# Patient Record
Sex: Male | Born: 1980 | Race: White | Hispanic: No | Marital: Single | Smoking: Never smoker
Health system: Southern US, Community
[De-identification: ages and names within clinical notes are randomized; demographics above are authoritative.]

## PROBLEM LIST (undated history)

## (undated) DIAGNOSIS — T7840XA Allergy, unspecified, initial encounter: Secondary | ICD-10-CM

## (undated) DIAGNOSIS — K921 Melena: Secondary | ICD-10-CM

## (undated) HISTORY — PX: SHOULDER SURGERY: SHX246

## (undated) HISTORY — DX: Melena: K92.1

## (undated) HISTORY — PX: ANTERIOR CRUCIATE LIGAMENT REPAIR: SHX115

## (undated) HISTORY — PX: MENISCUS REPAIR: SHX5179

## (undated) HISTORY — DX: Allergy, unspecified, initial encounter: T78.40XA

---
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
## 2006-03-15 ENCOUNTER — Ambulatory Visit: Payer: Self-pay | Admitting: Family Medicine

## 2006-06-01 ENCOUNTER — Ambulatory Visit: Payer: Self-pay | Admitting: Family Medicine

## 2008-01-19 ENCOUNTER — Ambulatory Visit: Payer: Self-pay | Admitting: Family Medicine

## 2011-03-10 ENCOUNTER — Other Ambulatory Visit: Payer: Self-pay | Admitting: Neurological Surgery

## 2011-03-10 DIAGNOSIS — M542 Cervicalgia: Secondary | ICD-10-CM

## 2011-03-14 ENCOUNTER — Other Ambulatory Visit: Payer: Self-pay

## 2011-09-28 ENCOUNTER — Ambulatory Visit (INDEPENDENT_AMBULATORY_CARE_PROVIDER_SITE_OTHER): Payer: Managed Care, Other (non HMO)

## 2011-09-28 DIAGNOSIS — R05 Cough: Secondary | ICD-10-CM

## 2011-09-28 DIAGNOSIS — IMO0001 Reserved for inherently not codable concepts without codable children: Secondary | ICD-10-CM

## 2013-06-12 ENCOUNTER — Ambulatory Visit (INDEPENDENT_AMBULATORY_CARE_PROVIDER_SITE_OTHER): Payer: BC Managed Care – PPO | Admitting: Nurse Practitioner

## 2013-06-12 ENCOUNTER — Encounter: Payer: Self-pay | Admitting: Nurse Practitioner

## 2013-06-12 VITALS — BP 104/70 | HR 53 | Temp 97.8°F | Ht 74.5 in | Wt 192.0 lb

## 2013-06-12 DIAGNOSIS — L255 Unspecified contact dermatitis due to plants, except food: Secondary | ICD-10-CM

## 2013-06-12 DIAGNOSIS — Z23 Encounter for immunization: Secondary | ICD-10-CM

## 2013-06-12 MED ORDER — TETANUS-DIPHTH-ACELL PERTUSSIS 5-2.5-18.5 LF-MCG/0.5 IM SUSP
0.5000 mL | Freq: Once | INTRAMUSCULAR | Status: AC
  Administered 2013-06-12: 0.5 mL via INTRAMUSCULAR

## 2013-06-12 MED ORDER — PREDNISONE 10 MG PO TABS: ORAL_TABLET | ORAL | Status: DC

## 2013-06-12 MED ORDER — METHYLPREDNISOLONE ACETATE 40 MG/ML IJ SUSP
40.0000 mg | Freq: Once | INTRAMUSCULAR | Status: AC
  Administered 2013-06-12: 40 mg via INTRAMUSCULAR

## 2013-06-12 NOTE — Patient Instructions (Signed)
Stop oral benadryl. Start benadryl (diphenhydramine) cream on rash. Use ice to help with itching. Start prednisone no sooner than tomorrow if rash is getting worse. Zanafel wash can help neutralize plant oils on surfaces as they can cause rash up to 1 year. Wash clothes that may have plant oils on them in hot water. Schedule physical at your convenience.  Poison [employer] ivy is a inflammation of the skin (contact dermatitis) caused by touching the allergens on the leaves of the ivy plant following previous exposure to the plant. The rash usually appears 48 hours after exposure. The rash is usually bumps (papules) or blisters (vesicles) in a linear pattern. Depending on your own sensitivity, the rash may simply cause redness and itching, or it may also progress to blisters which may break open. These must be well cared for to prevent secondary bacterial (germ) infection, followed by scarring. Keep any open areas dry, clean, dressed, and covered with an antibacterial ointment if needed. The eyes may also get puffy. The puffiness is worst in the morning and gets better as the day progresses. This dermatitis usually heals without scarring, within 2 to 3 weeks without treatment. HOME CARE INSTRUCTIONS  Thoroughly wash with soap and water as soon as you have been exposed to poison ivy. You have about one half hour to remove the plant resin before it will cause the rash. This washing will destroy the oil or antigen on the skin that is causing, or will cause, the rash. Be sure to wash under your fingernails as any plant resin there will continue to spread the rash. Do not rub skin vigorously when washing affected area. Poison ivy cannot spread if no oil from the plant remains on your body. A rash that has progressed to weeping sores will not spread the rash unless you have not washed thoroughly. It is also important to wash any clothes you have been wearing as these may carry active allergens. The rash will return if  you wear the unwashed clothing, even several days later. Avoidance of the plant in the future is the best measure. Poison ivy plant can be recognized by the number of leaves. Generally, poison ivy has three leaves with flowering branches on a single stem. Diphenhydramine may be purchased over the counter and used as needed for itching. Do not drive with this medication if it makes you drowsy.Ask your caregiver about medication for children. SEEK MEDICAL CARE IF:  Open sores develop.  Redness spreads beyond area of rash.  You notice purulent (pus-like) discharge.  You have increased pain.  Other signs of infection develop (such as fever). Document Released: 08/21/2000 Document Revised: 11/16/2011 Document Reviewed: 07/10/2009 [facility] Patient Information 2014 [place], [place].

## 2013-06-12 NOTE — Progress Notes (Signed)
  Subjective:    Patient ID: Daniel Guzman, male    DOB: 17-Oct-1980, 32 y.o.   MRN: [id number]  HPI Comments: Pt wishes to establish care here. He has been receiving primary care from Dr Juleen China. Last physical was 09/2012. Signifiacnt medical history includes bright red blood on tissue & in toilet at times. Pt thinks may be r/t caffeine intake and occasional straining at stool. PCP performed rectal exam at last physical. Pt states he was reassured no major problem.   Rash This is a new problem. The current episode started in the past 7 days. The problem has been gradually worsening since onset. The affected locations include the face, left lower leg, right lower leg, right arm and left arm. The rash is characterized by blistering, redness and itchiness. He was exposed to plant contact. Pertinent negatives include no cough, facial edema, fatigue, fever, joint pain, shortness of breath or vomiting. Past treatments include antihistamine. The treatment provided mild relief. His past medical history is significant for allergies (itchy eyes). There is no history of asthma or eczema.      Review of Systems  Constitutional: Negative for fever, activity change and fatigue.  HENT: Negative for facial swelling.   Eyes: Positive for redness (mildly injected sclera bilaterally).  Respiratory: Negative for cough, chest tightness, shortness of breath and wheezing.   Cardiovascular: Negative for chest pain.  Gastrointestinal: Positive for nausea (thinks r/t taking oral benadryl.). Negative for vomiting and abdominal pain.  Musculoskeletal: Negative for joint pain and arthralgias.  Skin: Positive for rash.       Objective:   Physical Exam  Constitutional: He is oriented to person, place, and time. He appears well-developed and well-nourished. No distress.  HENT:  Head: Normocephalic and atraumatic.  Right Ear: External ear normal.  Left Ear: External ear normal.  Erythema behind pinna L ear-pt states  itchy  Eyes: Right eye exhibits no discharge. Left eye exhibits no discharge.  Sclera slightly injected, does not c/o dry eyes.  Cardiovascular: Normal rate.   Pulmonary/Chest: Effort normal and breath sounds normal.  Musculoskeletal:  Normal gait  Neurological: He is alert and oriented to person, place, and time.  Skin: Skin is warm and dry. Lesion and rash noted. Rash is vesicular. There is erythema.     Linear vesicular rash, multiple areas.  Psychiatric: He has a normal mood and affect. His behavior is normal. Thought content normal.          Assessment & Plan:  1. Plant dermatitis See pt instructions. - methylPREDNISolone acetate (DEPO-MEDROL) injection 40 mg; Inject 1 mL (40 mg total) into the muscle once. - predniSONE (DELTASONE) 10 MG tablet; Take 3 T po qd X 3d, then, 2T po qd X3d, then 1T po qd X 3d, then 1/2 T po qd X 3d, then d/c.  Dispense: 20 tablet; Refill: 0 2 Need for Tdap. Admin today. Had flu vaccine 3 wks ago.

## 2013-06-12 NOTE — Addendum Note (Signed)
Addended by: Marlene Lard on: 06/12/2013 11:15 AM   Modules accepted: Orders

## 2013-09-12 ENCOUNTER — Ambulatory Visit (INDEPENDENT_AMBULATORY_CARE_PROVIDER_SITE_OTHER): Payer: BC Managed Care – PPO | Admitting: Nurse Practitioner

## 2013-09-12 ENCOUNTER — Encounter: Payer: Self-pay | Admitting: Nurse Practitioner

## 2013-09-12 VITALS — BP 122/80 | HR 62 | Temp 97.7°F | Ht 74.5 in | Wt 194.0 lb

## 2013-09-12 DIAGNOSIS — J069 Acute upper respiratory infection, unspecified: Secondary | ICD-10-CM

## 2013-09-12 NOTE — Progress Notes (Signed)
Pre-visit discussion using our clinic review tool. No additional management support is needed unless otherwise documented below in the visit note.  

## 2013-09-12 NOTE — Patient Instructions (Signed)
You have a virus causing your symptoms. The average duration of cold symptoms is 14 days. Start daily sinus rinses (neilmed Sinus Rinse). Use 30 mg to 60 mg pseudoephedrine twice daily. Sip fluids every hour. Rest. If you do not experience relief in your symptoms within 3 days, please call our office and I will prescribe an antibiotic. Feel better!  Upper Respiratory Infection, Adult An upper respiratory infection (URI) is also sometimes known as the common cold. The upper respiratory tract includes the nose, sinuses, throat, trachea, and bronchi. Bronchi are the airways leading to the lungs. Most people improve within 1 week, but symptoms can last up to 2 weeks. A residual cough may last even longer.  CAUSES Many different viruses can infect the tissues lining the upper respiratory tract. The tissues become irritated and inflamed and often become very moist. Mucus production is also common. A cold is contagious. You can easily spread the virus to others by oral contact. This includes kissing, sharing a glass, coughing, or sneezing. Touching your mouth or nose and then touching a surface, which is then touched by another person, can also spread the virus. SYMPTOMS  Symptoms typically develop 1 to 3 days after you come in contact with a cold virus. Symptoms vary from person to person. They may include:  Runny nose.  Sneezing.  Nasal congestion.  Sinus irritation.  Sore throat.  Loss of voice (laryngitis).  Cough.  Fatigue.  Muscle aches.  Loss of appetite.  Headache.  Low-grade fever. DIAGNOSIS  You might diagnose your own cold based on familiar symptoms, since most people get a cold 2 to 3 times a year. Your caregiver can confirm this based on your exam. Most importantly, your caregiver can check that your symptoms are not due to another disease such as strep throat, sinusitis, pneumonia, asthma, or epiglottitis. Blood tests, throat tests, and X-rays are not necessary to diagnose a  common cold, but they may sometimes be helpful in excluding other more serious diseases. Your caregiver will decide if any further tests are required. RISKS AND COMPLICATIONS  You may be at risk for a more severe case of the common cold if you smoke cigarettes, have chronic heart disease (such as heart failure) or lung disease (such as asthma), or if you have a weakened immune system. The very young and very old are also at risk for more serious infections. Bacterial sinusitis, middle ear infections, and bacterial pneumonia can complicate the common cold. The common cold can worsen asthma and chronic obstructive pulmonary disease (COPD). Sometimes, these complications can require emergency medical care and may be life-threatening. PREVENTION  The best way to protect against getting a cold is to practice good hygiene. Avoid oral or hand contact with people with cold symptoms. Wash your hands often if contact occurs. There is no clear evidence that vitamin C, vitamin E, echinacea, or exercise reduces the chance of developing a cold. However, it is always recommended to get plenty of rest and practice good nutrition. TREATMENT  Treatment is directed at relieving symptoms. There is no cure. Antibiotics are not effective, because the infection is caused by a virus, not by bacteria. Treatment may include:  Increased fluid intake. Sports drinks offer valuable electrolytes, sugars, and fluids.  Breathing heated mist or steam (vaporizer or shower).  Eating chicken soup or other clear broths, and maintaining good nutrition.  Getting plenty of rest.  Using gargles or lozenges for comfort.  Controlling fevers with ibuprofen or acetaminophen as directed by your  caregiver.  Increasing usage of your inhaler if you have asthma. Zinc gel and zinc lozenges, taken in the first 24 hours of the common cold, can shorten the duration and lessen the severity of symptoms. Pain medicines may help with fever, muscle  aches, and throat pain. A variety of non-prescription medicines are available to treat congestion and runny nose. Your caregiver can make recommendations and may suggest nasal or lung inhalers for other symptoms.  HOME CARE INSTRUCTIONS   Only take over-the-counter or prescription medicines for pain, discomfort, or fever as directed by your caregiver.  Use a warm mist humidifier or inhale steam from a shower to increase air moisture. This may keep secretions moist and make it easier to breathe.  Drink enough water and fluids to keep your urine clear or pale yellow.  Rest as needed.  Return to work when your temperature has returned to normal or as your caregiver advises. You may need to stay home longer to avoid infecting others. You can also use a face mask and careful hand washing to prevent spread of the virus. SEEK MEDICAL CARE IF:   After the first few days, you feel you are getting worse rather than better.  You need your caregiver's advice about medicines to control symptoms.  You develop chills, worsening shortness of breath, or brown or red sputum. These may be signs of pneumonia.  You develop yellow or brown nasal discharge or pain in the face, especially when you bend forward. These may be signs of sinusitis.  You develop a fever, swollen neck glands, pain with swallowing, or white areas in the back of your throat. These may be signs of strep throat. SEEK IMMEDIATE MEDICAL CARE IF:   You have a fever.  You develop severe or persistent headache, ear pain, sinus pain, or chest pain.  You develop wheezing, a prolonged cough, cough up blood, or have a change in your usual mucus (if you have chronic lung disease).  You develop sore muscles or a stiff neck. Document Released: 02/17/2001 Document Revised: 11/16/2011 Document Reviewed: 12/26/2010 [facility] Patient Information 2014 [place].

## 2013-09-12 NOTE — Progress Notes (Signed)
   Subjective:    Patient ID: Daniel MoatsMatthew D Guzman, male    DOB: 04/16/1981, 33 y.o.   MRN: [id number]  Sore Throat  This is a new problem. The current episode started 1 to 4 weeks ago (9 d). The problem has been unchanged. Neither side of throat is experiencing more pain than the other. There has been no fever. The patient is experiencing no pain. Associated symptoms include congestion (nasal & chest), coughing and headaches. Pertinent negatives include no abdominal pain, diarrhea, shortness of breath or swollen glands. He has had no exposure to strep or mono. Treatments tried: OTC cold meds. The treatment provided mild relief.      Review of Systems  Constitutional: Positive for fatigue.  HENT: Positive for congestion (nasal & chest), postnasal drip, sinus pressure, sore throat and voice change (nasal).   Respiratory: Positive for cough. Negative for chest tightness, shortness of breath and wheezing.   Gastrointestinal: Negative for abdominal pain and diarrhea.  Musculoskeletal: Negative for back pain.  Neurological: Positive for headaches.       Objective:   Physical Exam  Vitals reviewed. Constitutional: He is oriented to person, place, and time. He appears well-developed and well-nourished. No distress.  HENT:  Head: Normocephalic and atraumatic.  Right Ear: External ear normal.  Left Ear: External ear normal.  Mouth/Throat: Oropharynx is clear and moist. No oropharyngeal exudate.  Eyes: Conjunctivae are normal. Right eye exhibits no discharge. Left eye exhibits no discharge.  Neck: Normal range of motion. Neck supple. No thyromegaly present.  Cardiovascular: Normal rate, regular rhythm and normal heart sounds.   No murmur heard. Pulmonary/Chest: Effort normal and breath sounds normal. No respiratory distress. He has no wheezes.  Lymphadenopathy:    He has no cervical adenopathy.  Neurological: He is alert and oriented to person, place, and time.  Skin: Skin is warm and dry.    Psychiatric: He has a normal mood and affect. His behavior is normal. Thought content normal.          Assessment & Plan:  1. Acute upper respiratory infections of unspecified site See pt instructions.

## 2014-09-03 ENCOUNTER — Other Ambulatory Visit: Payer: Self-pay | Admitting: Orthopedic Surgery

## 2014-09-03 DIAGNOSIS — M5412 Radiculopathy, cervical region: Secondary | ICD-10-CM

## 2014-09-09 ENCOUNTER — Ambulatory Visit
Admission: RE | Admit: 2014-09-09 | Discharge: 2014-09-09 | Disposition: A | Discharge status: Home / Self Care | Payer: BC Managed Care – PPO | Source: Ambulatory Visit | Attending: Orthopedic Surgery | Admitting: Orthopedic Surgery

## 2014-09-09 DIAGNOSIS — M5412 Radiculopathy, cervical region: Secondary | ICD-10-CM

## 2016-01-09 ENCOUNTER — Ambulatory Visit (INDEPENDENT_AMBULATORY_CARE_PROVIDER_SITE_OTHER): Payer: BLUE CROSS/BLUE SHIELD | Admitting: Family Medicine

## 2016-01-09 ENCOUNTER — Encounter: Payer: Self-pay | Admitting: Family Medicine

## 2016-01-09 VITALS — BP 108/80 | HR 62 | Temp 97.7°F | Ht 75.0 in | Wt 196.0 lb

## 2016-01-09 DIAGNOSIS — R202 Paresthesia of skin: Secondary | ICD-10-CM

## 2016-01-09 DIAGNOSIS — Z7689 Persons encountering health services in other specified circumstances: Secondary | ICD-10-CM

## 2016-01-09 DIAGNOSIS — S86819A Strain of other muscle(s) and tendon(s) at lower leg level, unspecified leg, initial encounter: Secondary | ICD-10-CM | POA: Diagnosis not present

## 2016-01-09 DIAGNOSIS — S86119A Strain of other muscle(s) and tendon(s) of posterior muscle group at lower leg level, unspecified leg, initial encounter: Secondary | ICD-10-CM

## 2016-01-09 DIAGNOSIS — Z7189 Other specified counseling: Secondary | ICD-10-CM

## 2016-01-09 LAB — TSH: TSH: 1.64 u[IU]/mL (ref 0.35–4.50)

## 2016-01-09 LAB — VITAMIN B12: VITAMIN B 12: 501 pg/mL (ref 211–911)

## 2016-01-09 NOTE — Progress Notes (Signed)
HPI:  Daniel Guzman is here to establish care. Was doing yearly history with family friend, has had physical in last year with labs - reports all normal.   Has the following chronic problems that require follow up and concerns today:  Paresthesia: -started 4-5 months ago -3rd and 4th fingers of L hand: -only in tips of these fingers, goes away on weekend -comes and goes, usually notices in the evenings -does a lot of typing -no weakness, pain, malaise, paresthesias elsewhere -lifts weights, lb 185  Tight gastrocs: -started a year -was running a few miles, sprinting at the time -injured, "popped" both calves at one point - bruised initially -if runs on them painful and bruised, bilaterally -no weakness, numbness, malaise, fevers -cycles and does gentle running is ok but if runs fast bothers him -hx L acl and meniscus surgery in the past, R shoulder surgery, turn labrum - sees Dr. Thurston Hole   ROS negative for unless reported above: fevers, unintentional weight loss, hearing or vision loss, chest pain, palpitations, struggling to breath, hemoptysis, melena, hematochezia, hematuria, falls, loc, si, thoughts of self harm  Past Medical History  Diagnosis Date  . Blood in stool     thinks hemorrhoid, rare with BM  . Allergy     Past Surgical History  Procedure Laterality Date  . Meniscus repair      Left  . Shoulder surgery      Right  . Anterior cruciate ligament repair      Left    Family History  Problem Relation Age of Onset  . Cancer Father     bladder, Actos- thought to be related  . Diabetes Father   . Heart disease Maternal Grandfather   . Heart disease Paternal Grandmother   . Heart disease Paternal Grandfather     Social History   Social History  . Marital Status: Single    Spouse Name: N/A  . Number of Children: 0  . Years of Education: N/A   Occupational History  . buyer [name]   Social History Main Topics  . Smoking status:  Never Smoker   . Smokeless tobacco: Never Used  . Alcohol Use: 0.0 oz/week    0 Standard drinks or equivalent per week     Comment: few drinks occassionally  . Drug Use: No  . Sexual Activity: Not Asked   Other Topics Concern  . 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  Omega-3 Fatty Acids (FISH OIL PO), Take by mouth daily., Disp: , Rfl:   EXAM:  Filed Vitals:   01/09/16 0705  BP: 108/80  Pulse: 62  Temp: 97.7 F (36.5 C)    Body mass index is 24.5 kg/(m^2).  GENERAL: vitals reviewed and listed above, alert, oriented, appears well hydrated and in no acute distress  HEENT: atraumatic, conjunttiva clear, no obvious abnormalities on inspection of external nose and ears  NECK: no obvious masses on inspection  LUNGS: clear to auscultation bilaterally, no wheezes, rales or rhonchi, good air movement  CV: HRRR, no peripheral edema  MS:  moves all extremities without noticeable abnormality, normal inspection both hands except for calluses both hands, wrists, arms, normal sensitivity to light touch, normal strength throughout, neg tinels, neg phalens, neg ulnar tunnel tapping, Normal range of motion of the head and neck, no bony tenderness to palpation in the cervical spine, negative Spurling's test. Normal inspection of both lower extremities and calves, no tenderness to palpation over the deep veins, no swelling, normal calf muscle function and strength, no muscular or bony tenderness to palpation, neurovascularly intact distally   PSYCH: pleasant and cooperative, no obvious depression or anxiety  ASSESSMENT AND PLAN:  Discussed the following assessment and  plan:  Paresthesia - Plan: TSH, Vitamin B12 -Normal exam, suspect related to typing or lifting and local nerve issue, distribution is a little on -We'll check labs, advised proper typing position and mechanics and wrist brace at night to see if this resolves his issues -Follow up if worsening or not improving and in 3 months  Gastrocnemius muscle strain, unspecified laterality, initial encounter -Normal exam -Home exercises provided and advised gradual return to running -Follow up 3 months, sports medicine consult if not improving or symptoms recur  Encounter to establish care -We reviewed the PMH, PSH, FH, SH, Meds and Allergies. -We provided refills for any medications we will prescribe as needed. -We addressed current concerns per orders and patient instructions. -We have asked for records for pertinent exams, studies, vaccines and notes from previous providers. -We have advised patient to follow up per instructions below.   -Patient advised to return or notify a doctor immediately if symptoms worsen or persist or new concerns arise.  Patient Instructions  BEFORE YOU LEAVE: -labs -schedule follow up in 3 months  For the calf issues: -do the exercises 4 days per week -in 1 month start to gradually ease back into running, start slow, gradually build up mileage, then gradually build up speed -continue the exercises 4 days per week -halt process and let us know if any further bruising or significant pain  For the hand issues: -ensure proper mechanics with sitting position at desk -wear firm wrist support at night  We recommend the following healthy lifestyle measures: - eat a healthy whole foods diet consisting of regular small meals composed of vegetables, fruits, beans, nuts, seeds, healthy meats such as white chicken and fish and whole grains.  - avoid sweets, white starchy foods, fried foods, fast food, processed foods, sodas, red meet and other fattening foods.  - get a  least 150-300 minutes of aerobic exercise per week.   -We have ordered labs or studies at this visit. It can take up to 1-2 weeks for results and processing. We will contact you with instructions IF your results are abnormal. Normal results will be released to your [facility]. If you have not heard from [place] or can not find your results in [facility] in 2 weeks please contact our office.            Kriste BasqueKIM, Otto Caraway R.

## 2016-01-09 NOTE — Patient Instructions (Signed)
BEFORE YOU LEAVE: -labs -schedule follow up in 3 months  For the calf issues: -do the exercises 4 days per week -in 1 month start to gradually ease back into running, start slow, gradually build up mileage, then gradually build up speed -continue the exercises 4 days per week -halt process and let us know if any further bruising or significant pain  For the hand issues: -ensure proper mechanics with sitting position at desk -wear firm wrist support at night  We recommend the following healthy lifestyle measures: - eat a healthy whole foods diet consisting of regular small meals composed of vegetables, fruits, beans, nuts, seeds, healthy meats such as white chicken and fish and whole grains.  - avoid sweets, white starchy foods, fried foods, fast food, processed foods, sodas, red meet and other fattening foods.  - get a least 150-300 minutes of aerobic exercise per week.   -We have ordered labs or studies at this visit. It can take up to 1-2 weeks for results and processing. We will contact you with instructions IF your results are abnormal. Normal results will be released to your [facility]. If you have not heard from us or can not find your results in [facility] in 2 weeks please contact our office.

## 2016-02-26 IMAGING — MR MR CERVICAL SPINE W/O CM
5 series · 29 of 48 positions shown · non-contrast
Comparison: None.

CLINICAL DATA: Acute neck pain since Monday April, 2014. Pain worse on
the right with radiation. Worse with activity.

EXAM:
MRI CERVICAL SPINE WITHOUT CONTRAST
TECHNIQUE: Multiplanar, multisequence MR imaging of the cervical spine was
performed. No intravenous contrast was administered.

[Series 4: T2 · sagittal · 3.0mm · 0.66mm/px · 6 of 13 slices shown (1 of 2)]
[im 1/13]
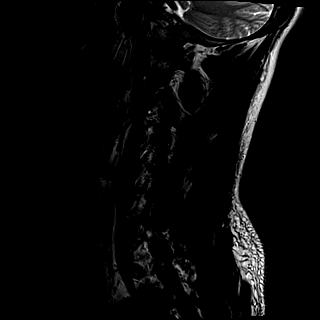
[im 3/13]
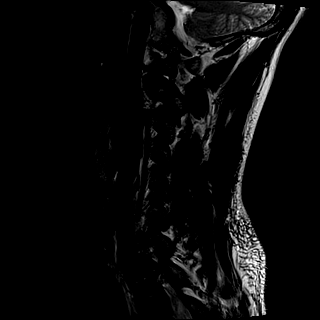
[im 5/13]
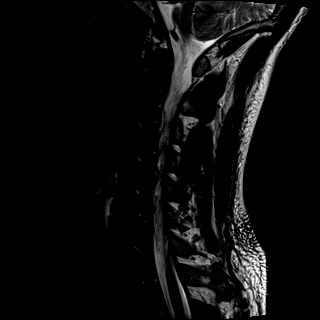
[im 8/13]
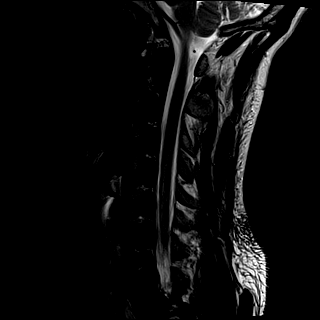
[im 10/13]
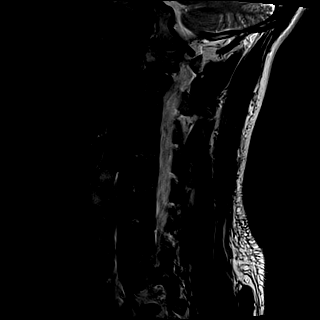
[im 13/13]
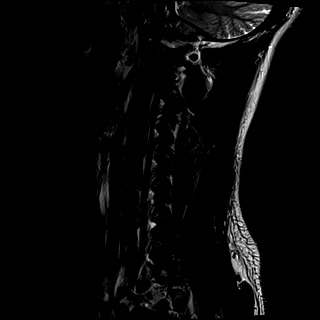

[Series 5: T1 · sagittal · 3.0mm · 0.41mm/px · 6 of 13 slices shown]
[im 1/13]
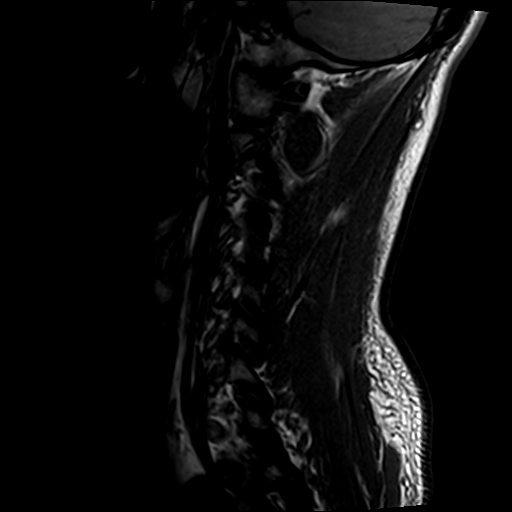
[im 3/13]
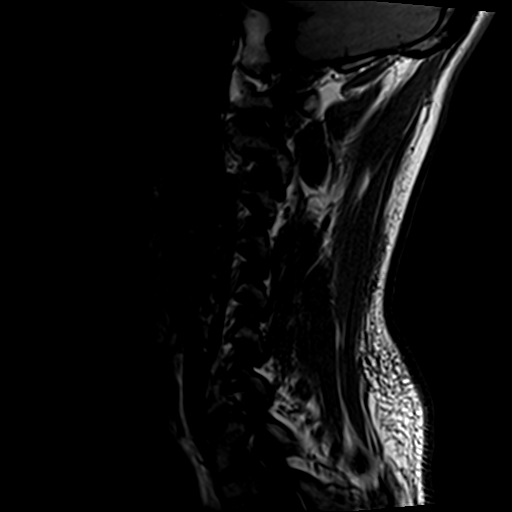
[im 5/13]
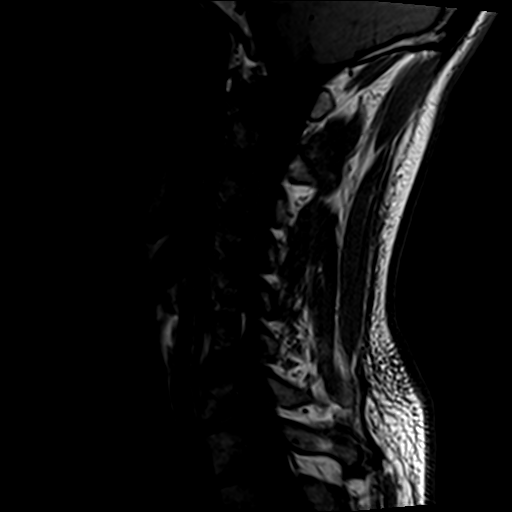
[im 8/13]
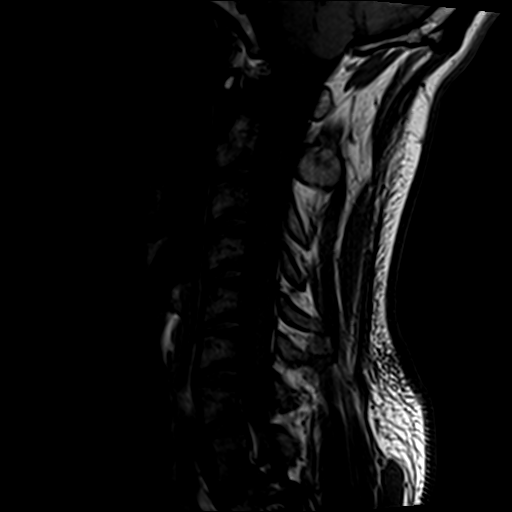
[im 10/13]
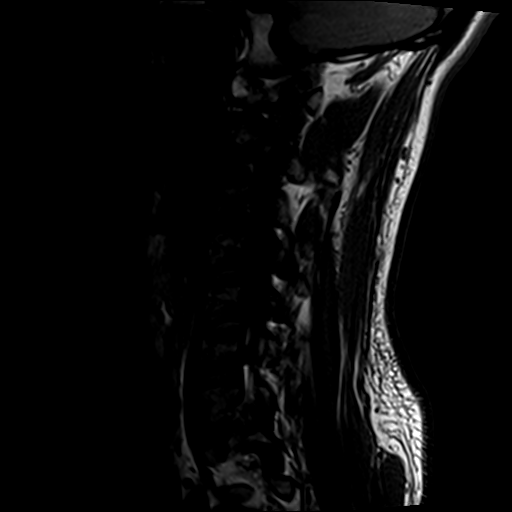
[im 13/13]
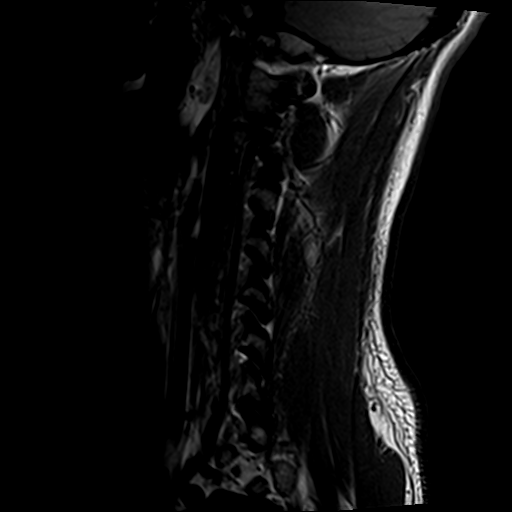

[Series 6: tir sag · sagittal · 3.0mm · 0.41mm/px · 6 of 13 slices shown]
[im 1/13]
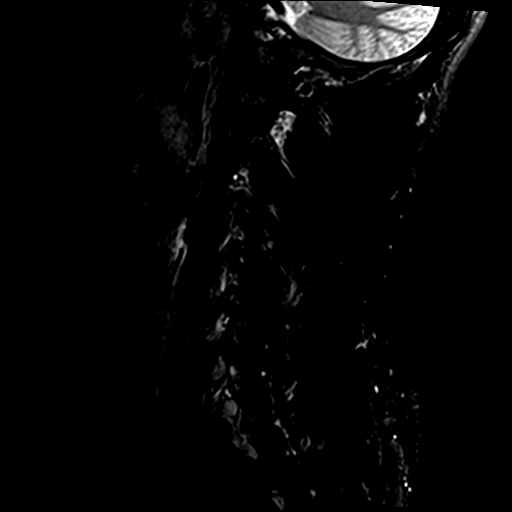
[im 3/13]
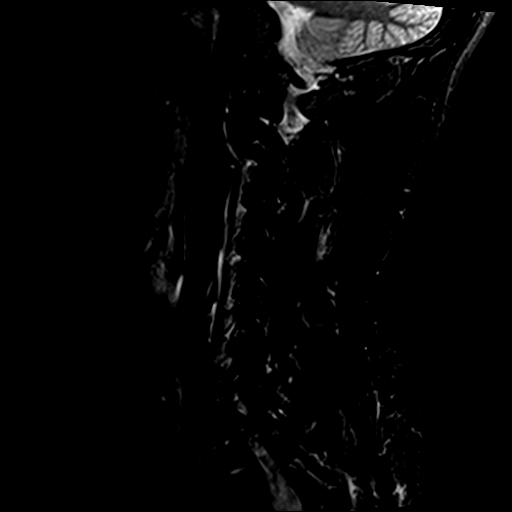
[im 5/13]
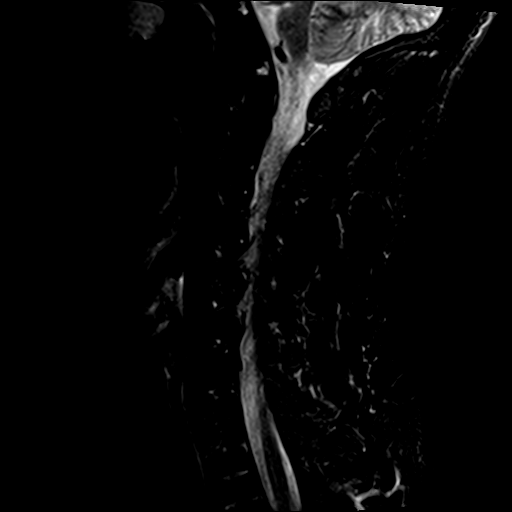
[im 8/13]
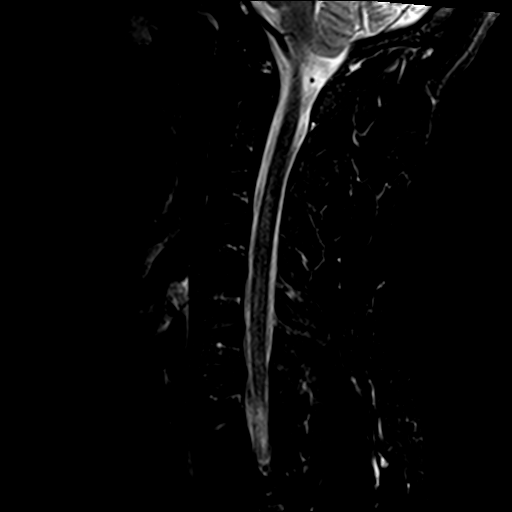
[im 10/13]
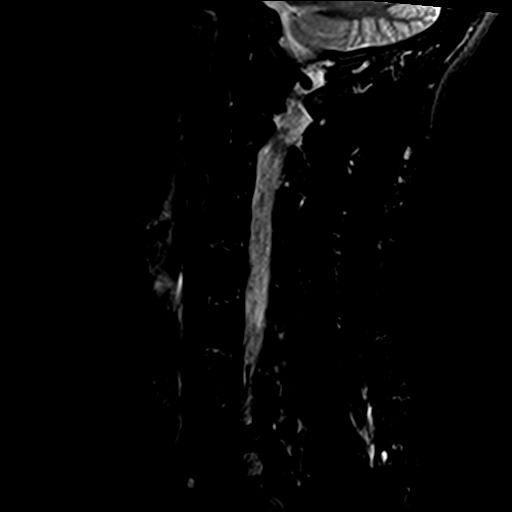
[im 13/13]
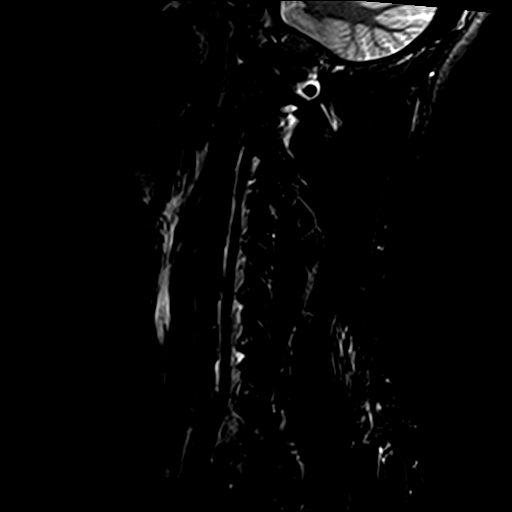

[Series 7: GRE · axial · 3.0mm · 0.35mm/px · z∈[-16,-1]mm · 2 of 32 slices shown]
[im 1/32]
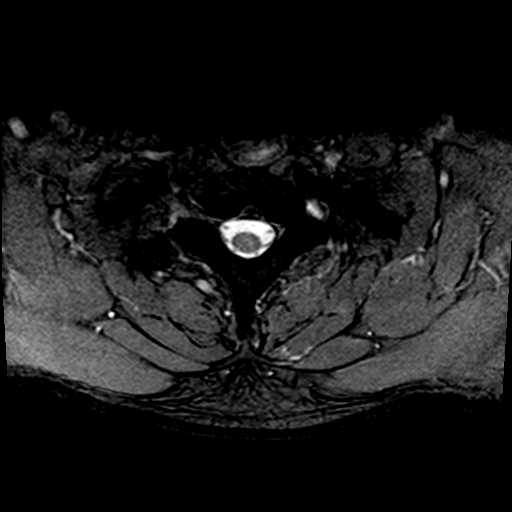
[im 5/32]
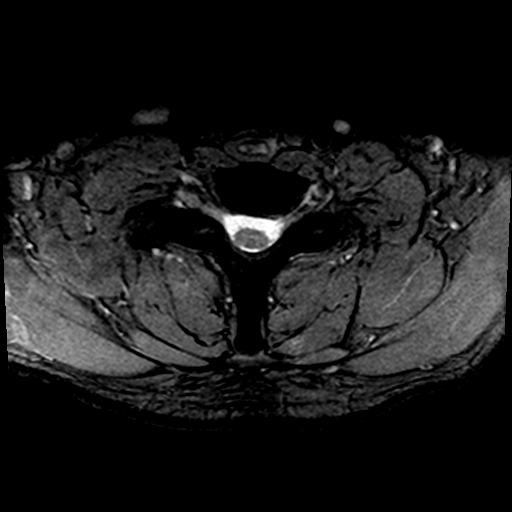

[Series 9: T2 · axial · 3.0mm · 0.70mm/px · z∈[-16,+99]mm · 9 of 32 slices shown (2 of 2)]
[im 1/32]
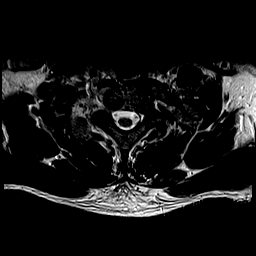
[im 5/32]
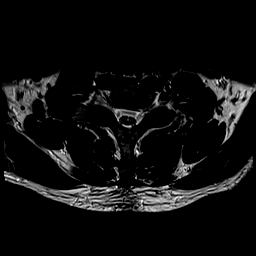
[im 9/32]
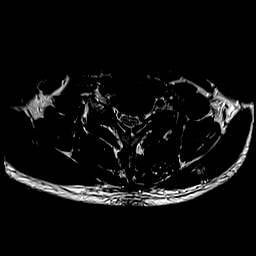
[im 14/32]
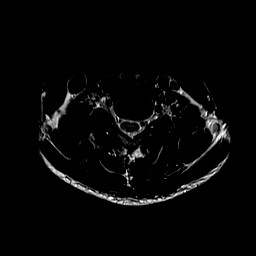
[im 16/32]
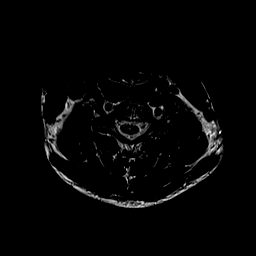
[im 18/32]
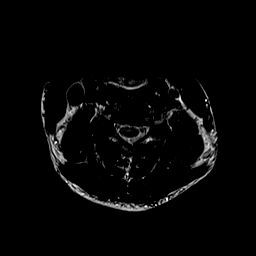
[im 23/32]
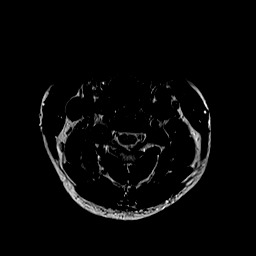
[im 27/32]
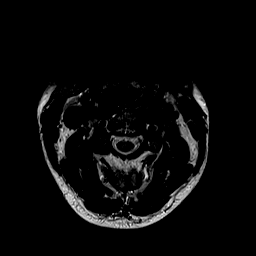
[im 32/32]
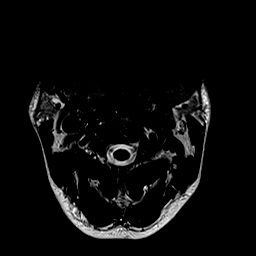

[29 of 48 positions shown; findings below may reference images not displayed]

FINDINGS: There is no abnormality at C5-6 or above. The foramen magnum is
widely patent. No disc pathology. No canal or foraminal stenosis. No
osseous or articular pathology.

C6-7: Mild bulging of the disc. No apparent compressive narrowing of
the canal or foramina.

C7-T1:  Normal interspace.

No abnormal cord signal.
IMPRESSION: Normal study with the exception of a non-compressive disc bulge at
C6-7.

## 2016-04-22 ENCOUNTER — Telehealth: Payer: Self-pay | Admitting: Family Medicine

## 2016-04-22 ENCOUNTER — Encounter: Payer: Self-pay | Admitting: Family Medicine

## 2016-04-22 NOTE — Telephone Encounter (Signed)
[facility] - Client  TELEPHONE ADVICE RECORD   [facility]    --------------------------------------------------------------------------------   Patient Name: Daniel MurphyMATTHEW Guzman  Gender: Male  DOB: 04/22/1981   Age: 89 Y 7 M 12 D  Return Phone Number: [phone]) [phone] (Primary)  Address:     City/State/Zip:  [place]     Client [facility] - Client  [facility]  Physician Kriste BasqueKim, Hannah- MD  Contact Type Call  Who Is Calling Patient / Member / Family / Caregiver  Call Type Triage / Clinical  Relationship To Patient Self  Return Phone Number [phone] (Primary)  Chief Complaint Urination Pain  Reason for Call Symptomatic / Request for Health Information  Initial Comment Caller states he is having burning after urination, and has blood in semen.  Appointment Disposition EMR Patient Reports Appointment Already Scheduled  Info pasted into Epic Yes  PreDisposition Go to Urgent Care/Walk-In Clinic  Translation No       Nurse Assessment  Nurse: Logan BoresEvans, RN, Melissa Date/Time (Eastern Time): 04/22/2016 5:01:16 PM  Confirm and document reason for call. If symptomatic, describe symptoms. You must click the next button to save text entered. ---Caller states he is having burning after urination, and has blood in semen.    Has the patient traveled out of the country within the last 30 days? ---Not Applicable    Does the patient have any new or worsening symptoms? ---Yes    Will a triage be completed? ---Yes    Related visit to physician within the last 2 weeks? ---No    Does the PT have any chronic conditions? (i.e. diabetes, asthma, etc.) ---No    Is this a behavioral health or substance abuse call? ---No           Guidelines          Guideline Title Affirmed Question Affirmed Notes Nurse Date/Time (Eastern Time)  Urinary Symptoms Urinating more frequently than usual (i.e., frequency)     Logan BoresEvans, RN, Melissa 04/22/2016 5:06:32 PM  Penis and Scrotum Symptoms Blood in semen    Evans, RN, Melissa 04/22/2016 5:11:52 PM    Disp. Time Lamount Cohen(Eastern Time) Disposition Final User    04/22/2016 5:10:13 PM See Physician within 24 Hours   Logan BoresEvans, RN, Efraim KaufmannMelissa      04/22/2016 5:12:45 PM See PCP When Office is Open (within 3 days) Yes Logan BoresEvans, RN, Efraim KaufmannMelissa            Caller Understands: Yes  Disagree/Comply: Financial traderComply           Caller Understands: Yes  Disagree/Comply: Comply       Care Advice Given Per Guideline        SEE PHYSICIAN WITHIN 24 HOURS: CALL BACK IF: * You become worse. * Fever occurs * Unable to urinate and bladder feels full  SEE PCP WITHIN 3 DAYS: * You need to be seen within 2 or 3 days. Call your doctor during regular office hours and make an appointment. An urgent care center is often the best source of care if your doctor's office is closed or you can't get an appointment. NOTE: If office will be open tomorrow, tell caller to call then, not in 3 days. CALL BACK IF: * Fever or pain occur * You become worse.        --------------------------------------------------------------------------------         Comments  User: Ardeen GarlandMelissa, Evans, RN Date/Time (Eastern Time): 04/22/2016  5:14:00 PM  Caller reports has had symptoms since June and has progressively gotten worse with the burning after urinating. Caller denies any pain or burning with urination , some increased frequency or urge to urinate, blood in semen seen today which prompted the call, taking Azo for the burning after urination.    Referrals  REFERRED TO PCP OFFICE

## 2016-04-23 ENCOUNTER — Other Ambulatory Visit (HOSPITAL_COMMUNITY)
Admission: RE | Admit: 2016-04-23 | Discharge: 2016-04-23 | Disposition: A | Discharge status: Home / Self Care | Payer: BLUE CROSS/BLUE SHIELD | Source: Ambulatory Visit | Attending: Family Medicine | Admitting: Family Medicine

## 2016-04-23 ENCOUNTER — Ambulatory Visit (INDEPENDENT_AMBULATORY_CARE_PROVIDER_SITE_OTHER): Payer: BLUE CROSS/BLUE SHIELD | Admitting: Family Medicine

## 2016-04-23 ENCOUNTER — Encounter: Payer: Self-pay | Admitting: Family Medicine

## 2016-04-23 VITALS — BP 108/80 | HR 70 | Temp 97.8°F | Ht 75.0 in | Wt 197.9 lb

## 2016-04-23 DIAGNOSIS — N342 Other urethritis: Secondary | ICD-10-CM

## 2016-04-23 DIAGNOSIS — Z113 Encounter for screening for infections with a predominantly sexual mode of transmission: Secondary | ICD-10-CM | POA: Insufficient documentation

## 2016-04-23 LAB — POCT URINALYSIS DIPSTICK
BILIRUBIN UA: NEGATIVE
Glucose, UA: NEGATIVE
KETONES UA: NEGATIVE
LEUKOCYTES UA: NEGATIVE
NITRITE UA: NEGATIVE
PH UA: 6
PROTEIN UA: NEGATIVE
RBC UA: NEGATIVE
Spec Grav, UA: <=1.005
Urobilinogen, UA: 0.2

## 2016-04-23 MED ORDER — AZITHROMYCIN 500 MG PO TABS: 1000.0000 mg | ORAL_TABLET | Freq: Every day | ORAL | 0 refills | Status: DC

## 2016-04-23 MED ORDER — CEFTRIAXONE SODIUM 250 MG IJ SOLR
250.0000 mg | Freq: Once | INTRAMUSCULAR | Status: AC
  Administered 2016-04-23: 250 mg via INTRAMUSCULAR

## 2016-04-23 NOTE — Patient Instructions (Addendum)
-  take the 2 pills today - sent to the pharmacy  -follow up if any persistent symptoms  -wife should notify her gyn of your symptoms

## 2016-04-23 NOTE — Telephone Encounter (Signed)
Patient seeing Dr. Selena BattenKim at 11:15am today, 04/23/16

## 2016-04-23 NOTE — Telephone Encounter (Signed)
Please Advise  Pt has an appointment with Dr. Selena BattenKim @ 11:15  Pt has burning after urination and blood in semen.

## 2016-04-23 NOTE — Progress Notes (Signed)
Pre visit review using our clinic review tool, if applicable. No additional management support is needed unless otherwise documented below in the visit note. 

## 2016-04-23 NOTE — Addendum Note (Signed)
Addended by: Johnella MoloneyFUNDERBURK, JO A on: 04/23/2016 02:01 PM   Modules accepted: Orders

## 2016-04-23 NOTE — Progress Notes (Signed)
HPI:  Acute visit for:  Dysuria: -started 1 week ago -burning of tip of penis after urinary -also wife thinks she saw blood in semen -sexually active, married this year, only with wife last 8 years - he declines testing for STI but agrees to empiric tx with rocephin and azithromycin -hc remote spermatocele - sees urologist -denies: fevers, malaise, chills, hematuria, penile dicharge, abd pain, flank pain  ROS: See pertinent positives and negatives per HPI.  Past Medical History:  Diagnosis Date  . Allergy   . Blood in stool    thinks hemorrhoid, rare with BM    Past Surgical History:  Procedure Laterality Date  . ANTERIOR CRUCIATE LIGAMENT REPAIR     Left  . MENISCUS REPAIR     Left  . SHOULDER SURGERY     Right    Family History  Problem Relation Age of Onset  . Cancer Father     bladder, Actos- thought to be related  . Diabetes Father   . Heart disease Maternal Grandfather   . Heart disease Paternal Grandmother   . Heart disease Paternal Grandfather     Social History   Social History  . Marital status: Single    Spouse name: N/A  . Number of children: 0  . 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 Smoking status: Never Smoker  . Smokeless tobacco: Never Used  . Alcohol use 0.0 oz/week     Comment: few drinks occassionally  . Drug use: No  . Sexual activity: Not Asked   Other Topics Concern  . 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  Omega-3 Fatty Acids (FISH OIL PO), Take by mouth daily., Disp: , Rfl:  .  azithromycin (ZITHROMAX) 500 MG tablet, Take 2 tablets (1,000 mg total) by mouth daily., Disp: 2 tablet, Rfl: 0  EXAM:  Vitals:   04/23/16 1121   BP: 108/80  Pulse: 70  Temp: 97.8 F (36.6 C)    Body mass index is 24.74 kg/m.  GENERAL: vitals reviewed and listed above, alert, oriented, appears well hydrated and in no acute distress  HEENT: atraumatic, conjunttiva clear, no obvious abnormalities on inspection of external nose and ears  NECK: no obvious masses on inspection  LUNGS: clear to auscultation bilaterally, no wheezes, rales or rhonchi, good air movement  CV: HRRR, no peripheral edema  GU: normal exam  MS: moves all extremities without noticeable abnormality  PSYCH: pleasant and cooperative, no obvious depression or anxiety  ASSESSMENT AND PLAN:  Discussed the following assessment and plan:  Urethritis - Plan: POC Urinalysis Dipstick, cefTRIAXone (ROCEPHIN) injection 250 mg  -we discussed possible serious and likely etiologies, workup and treatment, treatment risks and return precautions -after this discussion, Daniel Guzman opted for: -udip w; GC/Chlam urine pending -he declined further testing today with urethral swab but agreed to empiric tx suspected urethritis with azithro and rocephin -wife plans to see gyn per his report and has had testing recently -advised follow up if any recurrent or persistent symptoms -Patient advised to return or notify a doctor immediately if symptoms worsen or persist  or new concerns arise.  Patient Instructions  -take the 2 pills today - sent to the pharmacy  -follow up if any persistent symptoms  -wife should notify her gyn of your symptoms   Kriste BasqueKIM, HANNAH R., DO

## 2016-04-27 LAB — URINE CYTOLOGY ANCILLARY ONLY
Chlamydia: NEGATIVE
Neisseria Gonorrhea: NEGATIVE

## 2016-04-30 ENCOUNTER — Telehealth: Payer: Self-pay | Admitting: Family Medicine

## 2016-04-30 NOTE — Telephone Encounter (Signed)
[facility] - Client TELEPHONE ADVICE RECORD [facility] Patient Name: Daniel MurphyMATTHEW Guzman DOB: 06/15/1981 Initial Comment In office Thursday and had a few shots and still having the same issue after antibiotics UTI like symptoms Nurse Assessment Nurse: Vonita MossPeterson, RN, Daniel Guzman Date/Time (Eastern Time): 04/30/2016 5:54:28 PM Confirm and document reason for call. If symptomatic, describe symptoms. You must click the next button to save text entered. ---caller states that he had shots last week , had urination pain with some blood in semen . rocephin shot , and two other abx pills Has the patient traveled out of the country within the last 30 days? ---No Does the patient have any new or worsening symptoms? ---Yes Will a triage be completed? ---Yes Related visit to physician within the last 2 weeks? ---Yes Does the PT have any chronic conditions? (i.e. diabetes, asthma, etc.) ---No Is this a behavioral health or substance abuse call? ---No Guidelines Guideline Title Affirmed Question Affirmed Notes Urination Pain - Male Pus (white, yellow) or bloody discharge from end of penis Final Disposition User See Physician within 24 Hours Vonita MossPeterson, RN, Gala RomneyDoug Comments requesting to be seen tomorrow in the office . Referrals REFERRED TO PCP OFFICE Disagree/Comply: Comply

## 2016-04-30 NOTE — Progress Notes (Signed)
HPI:   Dysuria: -treated with asithromycin and rocephin - see OV 04/2016 -reports symptoms persist - burning tip of penis after urination -denies:heamturia, penile discharge, fevers, malaise, flank pain, skin irritation -udip and GC/chlam neg -white with to her gyn due to his symptoms and has yeast infection treated  ROS: See pertinent positives and negatives per HPI.  Past Medical History:  Diagnosis Date  . Allergy   . Blood in stool    thinks hemorrhoid, rare with BM    Past Surgical History:  Procedure Laterality Date  . ANTERIOR CRUCIATE LIGAMENT REPAIR     Left  . MENISCUS REPAIR     Left  . SHOULDER SURGERY     Right    Family History  Problem Relation Age of Onset  . Cancer Father     bladder, Actos- thought to be related  . Diabetes Father   . Heart disease Maternal Grandfather   . Heart disease Paternal Grandmother   . Heart disease Paternal Grandfather     Social History   Social History  . Marital status: Single    Spouse name: N/A  . Number of children: 0  . 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 Smoking status: Never Smoker  . Smokeless tobacco: Never Used  . Alcohol use 0.0 oz/week     Comment: few drinks occassionally  . Drug use: No  . Sexual activity: Not Asked   Other Topics Concern  . 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  Omega-3 Fatty Acids (FISH OIL PO), Take by mouth daily., Disp: , Rfl:  .  Probiotic Product (PROBIOTIC DAILY PO), Take by mouth., Disp: , Rfl:  .  TURMERIC PO, Take by mouth., Disp: , Rfl:  .  fluconazole (DIFLUCAN) 150 MG tablet, 1 tablet today. Repeat in 3 days., Disp: 2 tablet, Rfl: 0  EXAM:  Vitals:   05/01/16 1116  BP: (!) 100/58  Pulse: (!) 55  Temp: 98.3 F (36.8 C)    Body mass index is 24.85 kg/m.  GENERAL: vitals reviewed and listed above, alert, oriented, appears well hydrated and in no acute distress  HEENT: atraumatic, conjunttiva clear, no obvious abnormalities on inspection of external nose and ears  NECK: no obvious masses on inspection  GU: declined as normal several days ago and no change  MS: moves all extremities without noticeable abnormality  PSYCH: pleasant and cooperative, no obvious depression or anxiety  ASSESSMENT AND PLAN:  Discussed the following assessment and plan:  Dysuria - Plan: Urine culture  -repeat urine dip, micro and culture -he is concerned for yeast infection - will try diflucan  -if urine studies unrevealing and symptoms persist will have him see urologist -Patient advised to return or notify a doctor immediately if symptoms worsen or persist or new concerns arise.  Patient Instructions  BEFORE YOU LEAVE: -urinalysis, micro urine -culture urine  Take the diflucan per instructions.  Follow up pending labs results. Please notify us regardless if any symptoms worsen or if symptoms persist in 1 week.  We have ordered labs or studies at this  visit. It can take up to 1-2 weeks for results and processing. IF results require follow up or explanation, we will call you with instructions. Clinically stable results will be released to your [facility]. If you have not heard from [place] or cannot find your results in [facility] in 2 weeks please contact our office at [phone].  If you are not yet signed up for [facility], please consider signing up.          Colin Benton R., DO

## 2016-05-01 ENCOUNTER — Ambulatory Visit (INDEPENDENT_AMBULATORY_CARE_PROVIDER_SITE_OTHER): Payer: BLUE CROSS/BLUE SHIELD | Admitting: Family Medicine

## 2016-05-01 ENCOUNTER — Encounter: Payer: Self-pay | Admitting: Family Medicine

## 2016-05-01 VITALS — BP 100/58 | HR 55 | Temp 98.3°F | Ht 75.0 in | Wt 198.8 lb

## 2016-05-01 DIAGNOSIS — R300 Dysuria: Secondary | ICD-10-CM

## 2016-05-01 LAB — POCT URINALYSIS DIPSTICK
Bilirubin, UA: NEGATIVE
Blood, UA: NEGATIVE
Glucose, UA: NEGATIVE
Ketones, UA: NEGATIVE
Leukocytes, UA: NEGATIVE
Nitrite, UA: NEGATIVE
PROTEIN UA: NEGATIVE
SPEC GRAV UA: 1.02
UROBILINOGEN UA: 0.2
pH, UA: 6

## 2016-05-01 LAB — URINALYSIS, MICROSCOPIC ONLY: RBC / HPF: NONE SEEN (ref 0–?)

## 2016-05-01 MED ORDER — FLUCONAZOLE 150 MG PO TABS: ORAL_TABLET | ORAL | 0 refills | Status: AC

## 2016-05-01 NOTE — Addendum Note (Signed)
Addended by: Johnella MoloneyFUNDERBURK, JO A on: 05/01/2016 11:43 AM   Modules accepted: Orders

## 2016-05-01 NOTE — Progress Notes (Signed)
Pre visit review using our clinic review tool, if applicable. No additional management support is needed unless otherwise documented below in the visit note. 

## 2016-05-01 NOTE — Patient Instructions (Signed)
BEFORE YOU LEAVE: -urinalysis, micro urine -culture urine  Take the diflucan per instructions.  Follow up pending labs results. Please notify us regardless if any symptoms worsen or if symptoms persist in 1 week.  We have ordered labs or studies at this visit. It can take up to 1-2 weeks for results and processing. IF results require follow up or explanation, we will call you with instructions. Clinically stable results will be released to your [facility]. If you have not heard from us or cannot find your results in [facility] in 2 weeks please contact our office at [phone].  If you are not yet signed up for [facility], please consider signing up.

## 2016-05-03 LAB — URINE CULTURE: Organism ID, Bacteria: NO GROWTH

## 2016-05-04 ENCOUNTER — Telehealth: Payer: Self-pay | Admitting: Family Medicine

## 2016-05-04 DIAGNOSIS — R300 Dysuria: Secondary | ICD-10-CM

## 2016-05-04 NOTE — Addendum Note (Signed)
Addended by: Johnella MoloneyFUNDERBURK, JO A on: 05/04/2016 10:50 AM   Modules accepted: Orders

## 2016-05-04 NOTE — Telephone Encounter (Signed)
I called the pt and left a detailed message with the information below.  I left a message the referral was entered and someone will call him wiith appt info.

## 2016-05-04 NOTE — Telephone Encounter (Signed)
Was planning to call him today to see how he is doing.labs looked good. Agree, should see urology. Please place referral per his request for dysuria. Thanks.

## 2016-05-04 NOTE — Telephone Encounter (Signed)
Pt states his issue is not bette and would like referral [facility]  [facility]

## 2017-12-10 ENCOUNTER — Other Ambulatory Visit: Payer: Self-pay | Admitting: Plastic Surgery

## 2020-11-12 ENCOUNTER — Emergency Department (HOSPITAL_BASED_OUTPATIENT_CLINIC_OR_DEPARTMENT_OTHER)
Admission: EM | Admit: 2020-11-12 | Discharge: 2020-11-12 | Disposition: A | Discharge status: Home / Self Care | Payer: BC Managed Care – PPO | Attending: Emergency Medicine | Admitting: Emergency Medicine

## 2020-11-12 ENCOUNTER — Other Ambulatory Visit: Payer: Self-pay

## 2020-11-12 ENCOUNTER — Encounter (HOSPITAL_BASED_OUTPATIENT_CLINIC_OR_DEPARTMENT_OTHER): Payer: Self-pay | Admitting: Emergency Medicine

## 2020-11-12 DIAGNOSIS — K625 Hemorrhage of anus and rectum: Secondary | ICD-10-CM | POA: Diagnosis present

## 2020-11-12 DIAGNOSIS — K648 Other hemorrhoids: Secondary | ICD-10-CM | POA: Diagnosis not present

## 2020-11-12 DIAGNOSIS — K649 Unspecified hemorrhoids: Secondary | ICD-10-CM

## 2020-11-12 LAB — CBC
HCT: 46.4 % (ref 39.0–52.0)
Hemoglobin: 16.8 g/dL (ref 13.0–17.0)
MCH: 32.6 pg (ref 26.0–34.0)
MCHC: 36.2 g/dL — ABNORMAL HIGH (ref 30.0–36.0)
MCV: 90.1 fL (ref 80.0–100.0)
Platelets: 215 10*3/uL (ref 150–400)
RBC: 5.15 MIL/uL (ref 4.22–5.81)
RDW: 11.4 % — ABNORMAL LOW (ref 11.5–15.5)
WBC: 4.9 10*3/uL (ref 4.0–10.5)
nRBC: 0 % (ref 0.0–0.2)

## 2020-11-12 LAB — OCCULT BLOOD X 1 CARD TO LAB, STOOL: Fecal Occult Bld: NEGATIVE

## 2020-11-12 MED ORDER — BISACODYL 5 MG PO TBEC: 10.0000 mg | DELAYED_RELEASE_TABLET | Freq: Every day | ORAL | 0 refills | Status: AC

## 2020-11-12 NOTE — ED Triage Notes (Signed)
States has had BRB from his bottom with the poop he states x 1 week states yesterday it was more blood then last time and needs to have it checked, is not on any blood thinners , And has has this before but not to this amt ,   May have been constipated   A little ,

## 2020-11-12 NOTE — Discharge Instructions (Addendum)
Your hemoglobin is normal today which is reassuring.  You may continue to see some bleeding.  Please use prescribed stool softener, and make sure you are drinking plenty of water, try and avoid pushing or straining when having bowel movements as this will worsen bleeding.  Continue doing your home management for hemorrhoids.  Follow-up with GI or general surgery for further evaluation and treatment of hemorrhoids.

## 2020-11-12 NOTE — ED Provider Notes (Signed)
[facility] EMERGENCY DEPARTMENT Provider Note   CSN: [id number] Arrival date & time: 11/12/20  0850     History Chief Complaint  Patient presents with  . Rectal Bleeding    Daniel Guzman is a 40 y.o. male.  Daniel Guzman is a 40 y.o. male with a history of hemorrhoids, otherwise healthy, presents to the ED for evaluation of rectal bleeding.  Patient reports that over the past week he has noted bright red blood in the toilet after having a bowel movement.  He reports no blood mixed in with the stool, and only has bleeding after passing some stool.  He denies any associated rectal pain which he has had sometimes in the past with hemorrhoids and bleeding.  Denies any associated abdominal pain.  No fevers or chills.  No lightheadedness, syncope, palpitations or fatigue.  Reports this morning he saw more blood and became.  He has had hemorrhoids for many years, but has always been able to treat them with home remedies, has been using Preparation H, Tucks pads at home without any improvement and so became concerned, has a follow-up appointment with his GI doctor, but was worried due to increasing amounts of blood.        Past Medical History:  Diagnosis Date  . Allergy   . Blood in stool    thinks hemorrhoid, rare with BM    There are no problems to display for this patient.   Past Surgical History:  Procedure Laterality Date  . ANTERIOR CRUCIATE LIGAMENT REPAIR     Left  . MENISCUS REPAIR     Left  . SHOULDER SURGERY     Right       Family History  Problem Relation Age of Onset  . Cancer Father        bladder, Actos- thought to be related  . Diabetes Father   . Heart disease Maternal Grandfather   . Heart disease Paternal Grandmother   . Heart disease Paternal Grandfather     Social History   Tobacco Use  . Smoking status: Never Smoker  . Smokeless tobacco: Never Used  Substance Use Topics  . Alcohol use: Yes    Alcohol/week: 0.0 standard  drinks    Comment: few drinks occassionally  . Drug use: No    Home Medications Prior to Admission medications   Medication Sig Start Date End Date Taking? Authorizing Provider  bisacodyl (DULCOLAX) 5 MG EC tablet Take 2 tablets (10 mg total) by mouth daily. 11/12/20  Yes Dartha Lodge, PA-C  fluconazole (DIFLUCAN) 150 MG tablet 1 tablet today. Repeat in 3 days. 05/01/16   Terressa Koyanagi, DO  Omega-3 Fatty Acids (FISH OIL PO) Take by mouth daily.    [provider]  Probiotic Product (PROBIOTIC DAILY PO) Take by mouth.    [provider]  TURMERIC PO Take by mouth.    [provider]    Allergies    Patient has no known allergies.  Review of Systems   Review of Systems  Constitutional: Negative for chills and fever.  Gastrointestinal: Positive for anal bleeding. Negative for abdominal pain, blood in stool, constipation, diarrhea, nausea, rectal pain and vomiting.  Genitourinary: Negative for dysuria and frequency.  Musculoskeletal: Negative for arthralgias and myalgias.  Skin: Negative for color change and rash.  Neurological: Negative for dizziness, syncope and light-headedness.  All other systems reviewed and are negative.   Physical Exam Updated Vital Signs BP (!) 129/96 (BP Location:  Right Arm)   Pulse 71   Temp 98.7 F (37.1 C) (Oral)   Resp 20   Ht 6\' 3"  (1.905 m)   Wt 90.7 kg   SpO2 100%   BMI 25.00 kg/m   Physical Exam Vitals and nursing note reviewed. Exam conducted with a chaperone present.  Constitutional:      General: He is not in acute distress.    Appearance: Normal appearance. He is well-developed, normal weight and well-nourished. He is not ill-appearing or diaphoretic.     Comments: Well-appearing and in no distress   HENT:     Head: Normocephalic and atraumatic.  Eyes:     General:        Right eye: No discharge.        Left eye: No discharge.  Cardiovascular:     Rate and Rhythm: Normal rate.  Pulmonary:     Effort:  Pulmonary effort is normal. No respiratory distress.  Abdominal:     General: Bowel sounds are normal. There is no distension.     Palpations: Abdomen is soft. There is no mass.     Tenderness: There is no abdominal tenderness. There is no guarding.     Comments: Abdomen soft, nondistended, nontender to palpation in all quadrants without guarding or peritoneal signs   Genitourinary:    Comments: Chaperone present during rectal exam. No external hemorrhoids, fissures or tears noted.  No bleeding noted.  On internal exam patient with palpable nonpainful internal hemorrhoid, no bright red blood on exam, soft brown stool present, no melena.  Normal rectal tone. Musculoskeletal:        General: No deformity.  Skin:    General: Skin is warm and dry.     Findings: No rash.  Neurological:     Mental Status: He is alert and oriented to person, place, and time.     Coordination: Coordination normal.  Psychiatric:        Mood and Affect: Mood and affect and mood normal.        Behavior: Behavior normal.     ED Results / Procedures / Treatments   Labs (all labs ordered are listed, but only abnormal results are displayed) Labs Reviewed  CBC - Abnormal; Notable for the following components:      Result Value   MCHC 36.2 (*)    RDW 11.4 (*)    All other components within normal limits  OCCULT BLOOD X 1 CARD TO LAB, STOOL    EKG None  Radiology No results found.  Procedures Procedures   Medications Ordered in ED Medications - No data to display  ED Course  I have reviewed the triage vital signs and the nursing notes.  Pertinent labs & imaging results that were available during my care of the patient were reviewed by me and considered in my medical decision making (see chart for details).    MDM Rules/Calculators/A&P                         40 year old male with known history of hemorrhoids and intermittent rectal bleeding, has had bleeding with bowel movements over the past  week, he denies associated rectal pain which she has had sometimes with bleeding in the past.  Has been trying home remedies such as Preparation H, Tucks pads, but continues to have bleeding and felt like it was increased today.  No blood clots, only sees blood after bowel movement and not mixed in with  stool.  No associated abdominal pain.  Patient is well-appearing with normal vitals.  On exam he does have a palpable internal hemorrhoid, no external hemorrhoids noted, no bright red blood on rectal exam and Hemoccult is negative.  Hemoglobin is normal despite reported bleeding which is very reassuring.  Will have patient follow-up with GI or general surgery for further management of hemorrhoids, but in the meantime recommend using a stool softener to avoid straining or pushing with bowel movements as this is likely worsening bleeding.  We will have him continue with home remedies as well encourage warm soaks.  Patient expresses understanding and agreement with this plan.  Return precautions discussed.  Discharged home in good condition.  Final Clinical Impression(s) / ED Diagnoses Final diagnoses:  Bleeding hemorrhoid    Rx / DC Orders ED Discharge Orders         Ordered    bisacodyl (DULCOLAX) 5 MG EC tablet  Daily        11/12/20 1137           Dartha Lodge, [place] 11/12/20 1145    Linwood Dibbles, MD 11/13/20 [phone]

## 2020-11-12 NOTE — ED Notes (Signed)
IV in left forearm removed
